# Patient Record
Sex: Female | Born: 1955 | Race: White | Hispanic: No | Smoking: Never smoker
Health system: Southern US, Community
[De-identification: ages and names within clinical notes are randomized; demographics above are authoritative.]

## PROBLEM LIST (undated history)

## (undated) DIAGNOSIS — E785 Hyperlipidemia, unspecified: Secondary | ICD-10-CM

## (undated) DIAGNOSIS — E079 Disorder of thyroid, unspecified: Secondary | ICD-10-CM

---

## 1999-04-19 ENCOUNTER — Other Ambulatory Visit: Admission: RE | Admit: 1999-04-19 | Discharge: 1999-04-19 | Payer: Self-pay | Admitting: Gynecology

## 2000-07-26 ENCOUNTER — Other Ambulatory Visit: Admission: RE | Admit: 2000-07-26 | Discharge: 2000-07-26 | Payer: Self-pay | Admitting: Gynecology

## 2001-08-21 ENCOUNTER — Other Ambulatory Visit: Admission: RE | Admit: 2001-08-21 | Discharge: 2001-08-21 | Payer: Self-pay | Admitting: Gynecology

## 2002-09-12 ENCOUNTER — Other Ambulatory Visit: Admission: RE | Admit: 2002-09-12 | Discharge: 2002-09-12 | Payer: Self-pay | Admitting: Gynecology

## 2003-09-28 ENCOUNTER — Other Ambulatory Visit: Admission: RE | Admit: 2003-09-28 | Discharge: 2003-09-28 | Payer: Self-pay | Admitting: Gynecology

## 2004-10-03 ENCOUNTER — Other Ambulatory Visit: Admission: RE | Admit: 2004-10-03 | Discharge: 2004-10-03 | Payer: Self-pay | Admitting: Gynecology

## 2005-11-07 ENCOUNTER — Other Ambulatory Visit: Admission: RE | Admit: 2005-11-07 | Discharge: 2005-11-07 | Payer: Self-pay | Admitting: Gynecology

## 2006-10-31 ENCOUNTER — Other Ambulatory Visit: Admission: RE | Admit: 2006-10-31 | Discharge: 2006-10-31 | Payer: Self-pay | Admitting: Gynecology

## 2007-11-04 ENCOUNTER — Other Ambulatory Visit: Admission: RE | Admit: 2007-11-04 | Discharge: 2007-11-04 | Payer: Self-pay | Admitting: Gynecology

## 2009-08-18 ENCOUNTER — Ambulatory Visit: Payer: Self-pay | Admitting: Vascular Surgery

## 2009-09-29 ENCOUNTER — Ambulatory Visit: Payer: Self-pay | Admitting: Vascular Surgery

## 2009-10-06 ENCOUNTER — Ambulatory Visit: Payer: Self-pay | Admitting: Vascular Surgery

## 2009-11-26 ENCOUNTER — Ambulatory Visit: Payer: Self-pay | Admitting: Vascular Surgery

## 2011-04-04 NOTE — Procedures (Signed)
DUPLEX DEEP VENOUS EXAM - LOWER EXTREMITY   INDICATION:  Followup laser ablation.   HISTORY:  Edema:  No  Trauma/Surgery:  Greater saphenous vein laser ablation on September 29, 2009  Pain:  No  PE:  No  Previous DVT:  No  Anticoagulants:  Other:   DUPLEX EXAM:                CFV   SFV   PopV  PTV    GSV                R  L  R  L  R  L  R   L  R  L  Thrombosis    o  o  o     o     o      +  Spontaneous   +  +  +     +     +      o  Phasic        +  +  +     +     +      o  Augmentation  +  +  +     +     +      o  Compressible  +  +  +     +     +      o  Competent     +  +  +     o     +      o   Legend:  + - yes  o - no  p - partial  D - decreased   IMPRESSION:  1. No evidence of right lower extremity deep vein thrombosis.  2. The right greater saphenous vein appears to be totally occluded      from the distal insertion site to the groin level.  3. Mild reflux is noted focally at the right saphenofemoral junction      and popliteal vein levels.          _____________________________  Larina Earthly, M.D.   CH/MEDQ  D:  10/07/2009  T:  10/07/2009  Job:  [id number]

## 2011-04-04 NOTE — Consult Note (Signed)
NEW PATIENT CONSULTATION   [name], [name]  DOB:  04-20-1956                                       08/18/2009  ZOXWR#:[id number]   Patient presents today for evaluation of right leg varicose veins and  venous hypertension.  She is a very pleasant, active, healthy 55-year-  old white female with progressive changes of varicose veins in her  medial right knee and medial right calf.  She reports these have been  progressive over time and reports increasing pain associated with these.  This is most pronounced over the varicosities themselves with prolonged  standing.  She does have some generalized aching in her calf as well.  She does not have any specific pain in her left leg and no varicosities  in her left leg.  She does not have any history of deep venous  thrombosis.   Past medical history is negative for major medical difficulties.  She  does have hypothyroidism.   Her family history is negative for premature atherosclerotic disease.   SOCIAL HISTORY:  She is married with 2 children.  She works as a  Company secretary.  She does not smoke or drink alcohol.   REVIEW OF SYSTEMS:  Weight is reported at 185 pounds.  She is 5 feet 10  inches tall.  She denies cardiac, pulmonary, GI, GU, or neuro  dysfunction.   ALLERGIES:  None.   MEDICATIONS:  Synthroid, estrogen, Prometrium, ibuprofen for pain, and  vitamin D.   PHYSICAL EXAMINATION:  A well-developed and well-nourished white female  appearing her stated of age 43.  Blood pressure is 125/78.  Pulse is 66.  Respirations 18.  Her radial pulses are 2+.  She does have 2+ dorsalis  pedis pulses bilaterally.  Left leg is noted to have a few scattered  telangiectasia but no varicose veins.  On her right leg, she has  prominent varicose veins in her medial knee extending down onto her  right medial calf.   She underwent formal venous duplex in our office, and this reveals gross  reflux throughout her  right great saphenous vein extending into the  varicosities.  Her deep system does not have any evidence of reflux.   I discussed options with patient.  She had seen an outlying vein center  and has been in thigh-high graduated compression stockings for  approximately 6 months.  She reports that despite this, she is having  increased discomfort with prolonged standing, making it difficult for  her to do her work, which requires prolonged standing and also her usual  activities of daily living.  I have recommended right leg great  saphenous vein ablation and stab phlebectomy for relief of her symptoms.  We will proceed with this once we have assured insurance coverage.   Larina Earthly, M.D.  Electronically Signed   TFE/MEDQ  D:  08/18/2009  T:  08/19/2009  Job:  [id number]   cc:   Gretta Cool, M.D.

## 2011-04-04 NOTE — Assessment & Plan Note (Signed)
OFFICE VISIT   Tracey [name], Tracey [name]  DOB:  04-Dec-1955                                       10/06/2009  ZOXWR#:[id number]   The patient presents today for a 1 week followup of her laser ablation  of her left great saphenous vein and stab phlebectomy of multiple  tributary varicosities over her thigh and calf.  She has mild to  moderate bruising from the procedure.  She does have some discomfort  over the medial knee area over the ablation and also her thigh.  The  incisions are healing quite nicely.  She underwent repeat venous duplex  today in our office and this reveals closure of her saphenous vein of  her great saphenous vein and also reveals no evidence of DVT.  I am  quite pleased with her initial result and plan to see her again in 6  weeks for final followup.   Larina Earthly, M.D.  Electronically Signed   TFE/MEDQ  D:  10/06/2009  T:  10/07/2009  Job:  [id number]

## 2011-04-04 NOTE — Assessment & Plan Note (Signed)
OFFICE VISIT   Tracey [name], Tracey [name]  DOB:  01/13/1956                                       11/26/2009  EAVWU#:[id number]   Patient presents today for final follow-up of her laser ablation of her  right great saphenous vein and stab phlebectomy on 09/29/09.  She is  quite pleased with the results.   She has complete resolution of any bruising that she had and no erythema  at the level of her ablation site.  Stab phlebectomy sites were all  healing quite nicely.   I am quite pleased with her initial result, as is the patient.  We plan  to see her again on an as-needed basis.     Larina Earthly, M.D.  Electronically Signed   TFE/MEDQ  D:  11/26/2009  T:  11/29/2009  Job:  [id number]

## 2011-04-04 NOTE — Procedures (Signed)
LOWER EXTREMITY VENOUS REFLUX EXAM   INDICATION:  Right lower extremity swelling and pain.   EXAM:  Using color-flow imaging and pulse Doppler spectral analysis, the  right common femoral, superficial femoral, popliteal, posterior tibial,  greater and lesser saphenous veins are evaluated.  There is no evidence  suggesting deep venous insufficiency in the right lower extremity.   The right saphenofemoral junction is not competent.  The right GSV is  not competent with the caliber as described below.   The right proximal short saphenous vein demonstrates competency.   GSV Diameter (used if found to be incompetent only)                                            Right    Left  Proximal Greater Saphenous Vein           0.85 cm  cm  Proximal-to-mid-thigh                     0.54 cm  cm  Mid thigh                                 0.49 cm  cm  Mid-distal thigh                          cm       cm  Distal thigh                              0.53 cm  cm  Knee                                      0.35 cm  cm   IMPRESSION:  1. Right greater saphenous vein reflux is identified with the caliber      ranging from 0.85 cm to 0.35 cm knee to groin.  2. The right greater saphenous vein is not aneurysmal.  3. The right greater saphenous vein is not tortuous.  4. The deep venous system is competent.  5. The right lesser saphenous vein is competent.  6. The greater saphenous vein demonstrates 2035 meters per second      reflux in the proximal thigh.         ___________________________________________  Larina Earthly, M.D.   CJ/MEDQ  D:  08/18/2009  T:  08/18/2009  Job:  [id number]

## 2011-04-04 NOTE — Assessment & Plan Note (Signed)
OFFICE VISIT   Tracey [name], Tracey [name]  DOB:  Sep 21, 1956                                       09/29/2009  ZOXWR#:[id number]   This patient presents today for right great saphenous vein laser  ablation and stab phlebectomy.  She tolerated the procedure without  immediate complication and will be seen again in 1 week for follow-up.   Larina Earthly, M.D.  Electronically Signed   TFE/MEDQ  D:  09/29/2009  T:  09/30/2009  Job:  [id number]

## 2015-07-31 ENCOUNTER — Encounter (HOSPITAL_BASED_OUTPATIENT_CLINIC_OR_DEPARTMENT_OTHER): Payer: Self-pay | Admitting: Emergency Medicine

## 2015-07-31 ENCOUNTER — Emergency Department (HOSPITAL_BASED_OUTPATIENT_CLINIC_OR_DEPARTMENT_OTHER)
Admission: EM | Admit: 2015-07-31 | Discharge: 2015-07-31 | Disposition: A | Discharge status: Home / Self Care | Payer: No Typology Code available for payment source | Attending: Emergency Medicine | Admitting: Emergency Medicine

## 2015-07-31 ENCOUNTER — Emergency Department (HOSPITAL_BASED_OUTPATIENT_CLINIC_OR_DEPARTMENT_OTHER): Payer: No Typology Code available for payment source

## 2015-07-31 DIAGNOSIS — Y92410 Unspecified street and highway as the place of occurrence of the external cause: Secondary | ICD-10-CM | POA: Diagnosis not present

## 2015-07-31 DIAGNOSIS — Y9389 Activity, other specified: Secondary | ICD-10-CM | POA: Insufficient documentation

## 2015-07-31 DIAGNOSIS — Z79899 Other long term (current) drug therapy: Secondary | ICD-10-CM | POA: Diagnosis not present

## 2015-07-31 DIAGNOSIS — S39012A Strain of muscle, fascia and tendon of lower back, initial encounter: Secondary | ICD-10-CM

## 2015-07-31 DIAGNOSIS — Y998 Other external cause status: Secondary | ICD-10-CM | POA: Insufficient documentation

## 2015-07-31 DIAGNOSIS — E079 Disorder of thyroid, unspecified: Secondary | ICD-10-CM | POA: Insufficient documentation

## 2015-07-31 DIAGNOSIS — S29012A Strain of muscle and tendon of back wall of thorax, initial encounter: Secondary | ICD-10-CM | POA: Insufficient documentation

## 2015-07-31 DIAGNOSIS — S299XXA Unspecified injury of thorax, initial encounter: Secondary | ICD-10-CM | POA: Diagnosis present

## 2015-07-31 HISTORY — DX: Hyperlipidemia, unspecified: E78.5

## 2015-07-31 HISTORY — DX: Disorder of thyroid, unspecified: E07.9

## 2015-07-31 MED ORDER — IBUPROFEN 800 MG PO TABS: 800.0000 mg | ORAL_TABLET | Freq: Three times a day (TID) | ORAL | Status: AC

## 2015-07-31 MED ORDER — CYCLOBENZAPRINE HCL 10 MG PO TABS: 10.0000 mg | ORAL_TABLET | Freq: Two times a day (BID) | ORAL | Status: AC | PRN

## 2015-07-31 MED ORDER — CYCLOBENZAPRINE HCL 10 MG PO TABS
10.0000 mg | ORAL_TABLET | Freq: Once | ORAL | Status: AC
  Administered 2015-07-31: 10 mg via ORAL
  Filled 2015-07-31: qty 1

## 2015-07-31 MED ORDER — IBUPROFEN 800 MG PO TABS
800.0000 mg | ORAL_TABLET | Freq: Once | ORAL | Status: AC
Start: 2015-07-31 — End: 2015-07-31
  Administered 2015-07-31: 800 mg via ORAL
  Filled 2015-07-31: qty 1

## 2015-07-31 NOTE — Discharge Instructions (Signed)
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
## 2018-10-23 ENCOUNTER — Other Ambulatory Visit: Payer: Self-pay | Admitting: Obstetrics & Gynecology

## 2018-10-23 DIAGNOSIS — Z1231 Encounter for screening mammogram for malignant neoplasm of breast: Secondary | ICD-10-CM

## 2018-11-06 ENCOUNTER — Ambulatory Visit
Admission: RE | Admit: 2018-11-06 | Discharge: 2018-11-06 | Disposition: A | Discharge status: Home / Self Care | Payer: BLUE CROSS/BLUE SHIELD | Source: Ambulatory Visit | Attending: Obstetrics & Gynecology | Admitting: Obstetrics & Gynecology

## 2018-11-06 DIAGNOSIS — Z1231 Encounter for screening mammogram for malignant neoplasm of breast: Secondary | ICD-10-CM

## 2019-09-25 ENCOUNTER — Other Ambulatory Visit: Payer: Self-pay | Admitting: Family Medicine

## 2019-09-25 DIAGNOSIS — Z1231 Encounter for screening mammogram for malignant neoplasm of breast: Secondary | ICD-10-CM

## 2019-11-19 ENCOUNTER — Other Ambulatory Visit: Payer: Self-pay

## 2019-11-19 DIAGNOSIS — Z20822 Contact with and (suspected) exposure to covid-19: Secondary | ICD-10-CM

## 2019-11-20 ENCOUNTER — Ambulatory Visit: Payer: BLUE CROSS/BLUE SHIELD

## 2019-11-20 LAB — NOVEL CORONAVIRUS, NAA: SARS-CoV-2, NAA: DETECTED — AB

## 2019-11-21 ENCOUNTER — Encounter (HOSPITAL_COMMUNITY): Payer: Self-pay | Admitting: Critical Care Medicine

## 2019-11-21 ENCOUNTER — Telehealth (HOSPITAL_COMMUNITY): Payer: Self-pay | Admitting: Critical Care Medicine

## 2019-11-21 DIAGNOSIS — E039 Hypothyroidism, unspecified: Secondary | ICD-10-CM | POA: Insufficient documentation

## 2019-11-21 NOTE — Telephone Encounter (Signed)
I connected with this patient who is Covid positive from December 30.  She became ill on 28 December.  She knows she needs to stay in isolation until December 8.  I gave her recommendations on repeat not retesting for 3 months.  I recommended she connect with her primary care provider next week.  She is not a monoclonal antibody candidate.  She has very mild symptoms

## 2019-12-30 ENCOUNTER — Ambulatory Visit
Admission: RE | Admit: 2019-12-30 | Discharge: 2019-12-30 | Disposition: A | Discharge status: Home / Self Care | Payer: BC Managed Care – PPO | Source: Ambulatory Visit | Attending: Family Medicine | Admitting: Family Medicine

## 2019-12-30 ENCOUNTER — Other Ambulatory Visit: Payer: Self-pay

## 2019-12-30 DIAGNOSIS — Z1231 Encounter for screening mammogram for malignant neoplasm of breast: Secondary | ICD-10-CM

## 2020-02-12 ENCOUNTER — Ambulatory Visit: Payer: BC Managed Care – PPO

## 2020-11-02 ENCOUNTER — Other Ambulatory Visit: Payer: Self-pay | Admitting: Family Medicine

## 2020-11-02 DIAGNOSIS — Z1231 Encounter for screening mammogram for malignant neoplasm of breast: Secondary | ICD-10-CM

## 2021-01-04 ENCOUNTER — Ambulatory Visit
Admission: RE | Admit: 2021-01-04 | Discharge: 2021-01-04 | Disposition: A | Discharge status: Home / Self Care | Payer: BC Managed Care – PPO | Source: Ambulatory Visit | Attending: Family Medicine | Admitting: Family Medicine

## 2021-01-04 ENCOUNTER — Other Ambulatory Visit: Payer: Self-pay

## 2021-01-04 DIAGNOSIS — Z1231 Encounter for screening mammogram for malignant neoplasm of breast: Secondary | ICD-10-CM

## 2021-10-25 LAB — EXTERNAL GENERIC LAB PROCEDURE: COLOGUARD: NEGATIVE

## 2021-10-25 LAB — COLOGUARD: COLOGUARD: NEGATIVE

## 2021-11-28 ENCOUNTER — Other Ambulatory Visit: Payer: Self-pay | Admitting: Family Medicine

## 2021-11-28 DIAGNOSIS — Z1231 Encounter for screening mammogram for malignant neoplasm of breast: Secondary | ICD-10-CM

## 2022-01-09 ENCOUNTER — Ambulatory Visit
Admission: RE | Admit: 2022-01-09 | Discharge: 2022-01-09 | Disposition: A | Discharge status: Home / Self Care | Payer: BC Managed Care – PPO | Source: Ambulatory Visit | Attending: Family Medicine | Admitting: Family Medicine

## 2022-01-09 ENCOUNTER — Other Ambulatory Visit: Payer: Self-pay

## 2022-01-09 DIAGNOSIS — Z1231 Encounter for screening mammogram for malignant neoplasm of breast: Secondary | ICD-10-CM

## 2022-11-28 ENCOUNTER — Other Ambulatory Visit: Payer: Self-pay | Admitting: Family Medicine

## 2022-11-28 DIAGNOSIS — Z1231 Encounter for screening mammogram for malignant neoplasm of breast: Secondary | ICD-10-CM

## 2023-01-19 ENCOUNTER — Ambulatory Visit
Admission: RE | Admit: 2023-01-19 | Discharge: 2023-01-19 | Disposition: A | Discharge status: Home / Self Care | Payer: BC Managed Care – PPO | Source: Ambulatory Visit | Attending: Family Medicine | Admitting: Family Medicine

## 2023-01-19 DIAGNOSIS — Z1231 Encounter for screening mammogram for malignant neoplasm of breast: Secondary | ICD-10-CM

## 2023-01-24 ENCOUNTER — Other Ambulatory Visit: Payer: Self-pay | Admitting: Family Medicine

## 2023-01-24 DIAGNOSIS — R928 Other abnormal and inconclusive findings on diagnostic imaging of breast: Secondary | ICD-10-CM

## 2023-02-05 ENCOUNTER — Encounter: Payer: Self-pay | Admitting: Family Medicine

## 2023-02-09 ENCOUNTER — Other Ambulatory Visit: Payer: Self-pay | Admitting: Family Medicine

## 2023-02-09 ENCOUNTER — Ambulatory Visit
Admission: RE | Admit: 2023-02-09 | Discharge: 2023-02-09 | Disposition: A | Discharge status: Home / Self Care | Payer: Medicaid Other | Source: Ambulatory Visit | Attending: Family Medicine | Admitting: Family Medicine

## 2023-02-09 DIAGNOSIS — R921 Mammographic calcification found on diagnostic imaging of breast: Secondary | ICD-10-CM

## 2023-02-09 DIAGNOSIS — R928 Other abnormal and inconclusive findings on diagnostic imaging of breast: Secondary | ICD-10-CM

## 2023-02-23 ENCOUNTER — Ambulatory Visit
Admission: RE | Admit: 2023-02-23 | Discharge: 2023-02-23 | Disposition: A | Discharge status: Home / Self Care | Payer: Medicaid Other | Source: Ambulatory Visit | Attending: Family Medicine | Admitting: Family Medicine

## 2023-02-23 DIAGNOSIS — R928 Other abnormal and inconclusive findings on diagnostic imaging of breast: Secondary | ICD-10-CM

## 2023-02-23 DIAGNOSIS — R921 Mammographic calcification found on diagnostic imaging of breast: Secondary | ICD-10-CM

## 2023-02-23 HISTORY — PX: BREAST BIOPSY: SHX20

## 2024-01-01 ENCOUNTER — Other Ambulatory Visit: Payer: Self-pay | Admitting: Family Medicine

## 2024-01-01 DIAGNOSIS — Z1231 Encounter for screening mammogram for malignant neoplasm of breast: Secondary | ICD-10-CM

## 2024-01-25 ENCOUNTER — Ambulatory Visit
Admission: RE | Admit: 2024-01-25 | Discharge: 2024-01-25 | Disposition: A | Discharge status: Home / Self Care | Payer: Medicare HMO | Source: Ambulatory Visit | Attending: Family Medicine | Admitting: Family Medicine

## 2024-01-25 DIAGNOSIS — Z1231 Encounter for screening mammogram for malignant neoplasm of breast: Secondary | ICD-10-CM

## 2024-01-31 ENCOUNTER — Other Ambulatory Visit: Payer: Self-pay | Admitting: Family Medicine

## 2024-01-31 DIAGNOSIS — R928 Other abnormal and inconclusive findings on diagnostic imaging of breast: Secondary | ICD-10-CM

## 2024-02-03 IMAGING — MG MM DIGITAL SCREENING BILAT W/ TOMO AND CAD
6 of 10 series · 6 of 30 positions shown · non-contrast
Comparison: Previous exam(s).

CLINICAL DATA: Screening.

EXAM:
DIGITAL SCREENING BILATERAL MAMMOGRAM WITH TOMOSYNTHESIS AND CAD
TECHNIQUE: Bilateral screening digital craniocaudal and mediolateral oblique
mammograms were obtained. Bilateral screening digital breast
tomosynthesis was performed. The images were evaluated with
computer-aided detection.

[R CC synth-2D]
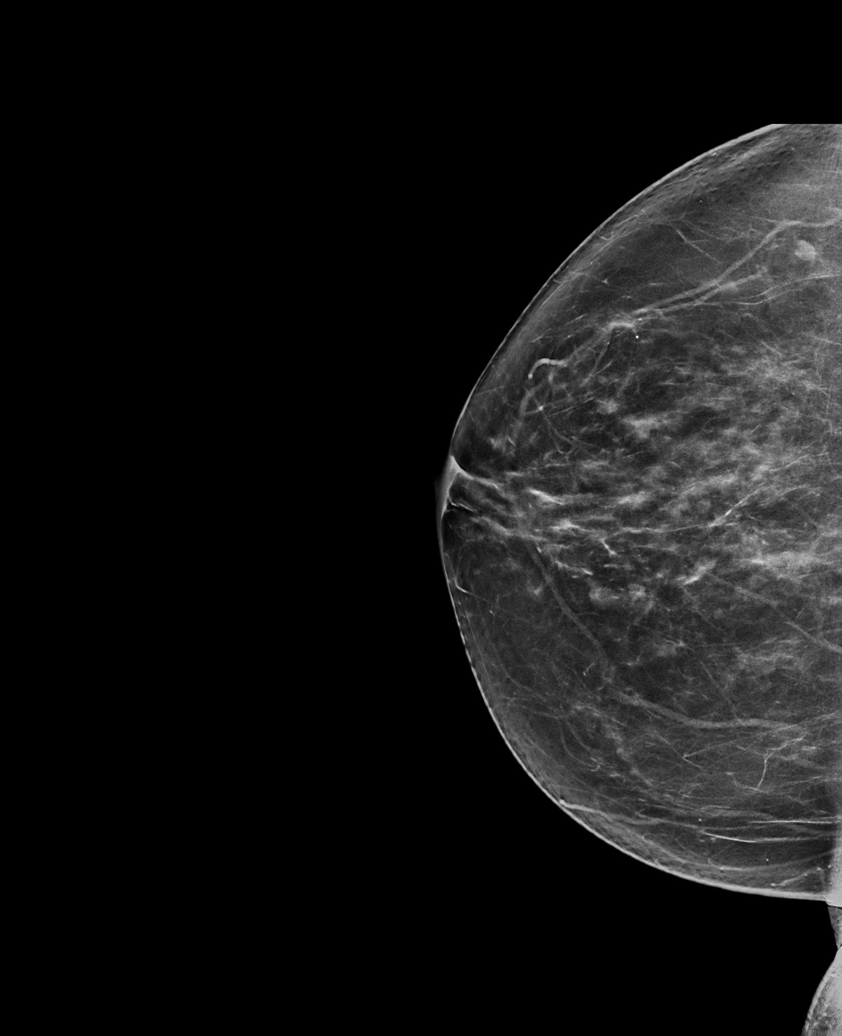

[R MLO synth-2D (1 of 2)]
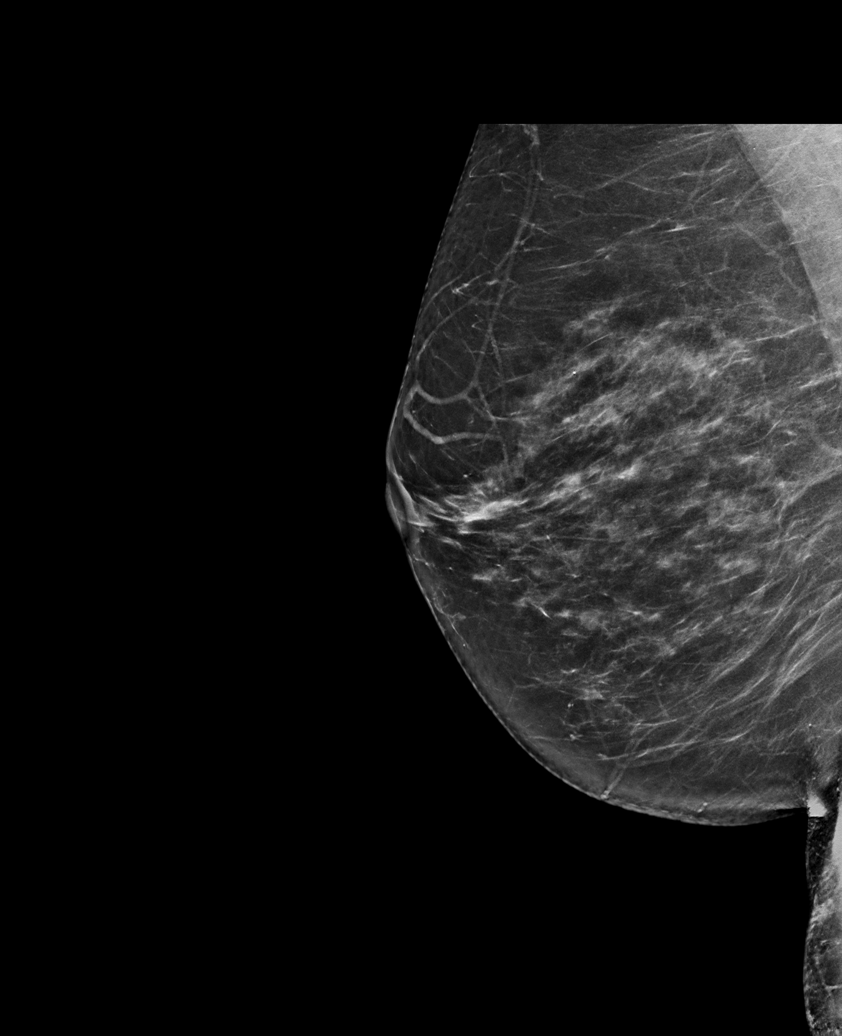

[L MLO synth-2D]
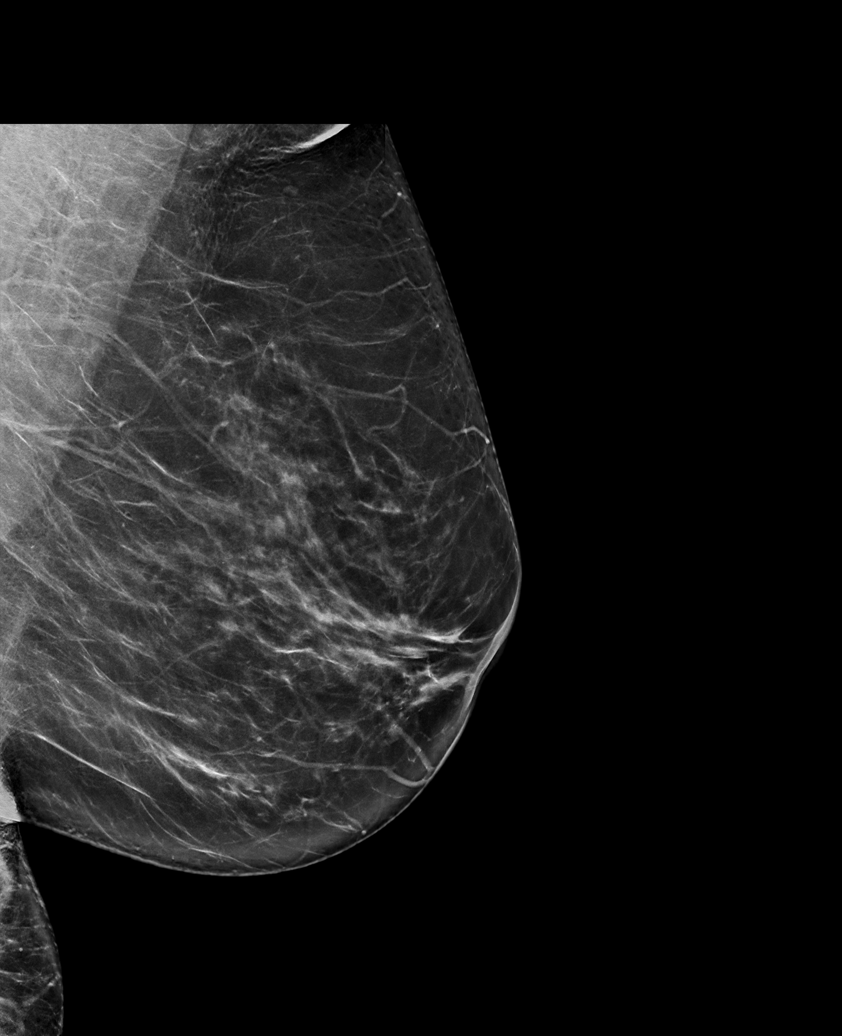

[R MLO synth-2D (2 of 2)]
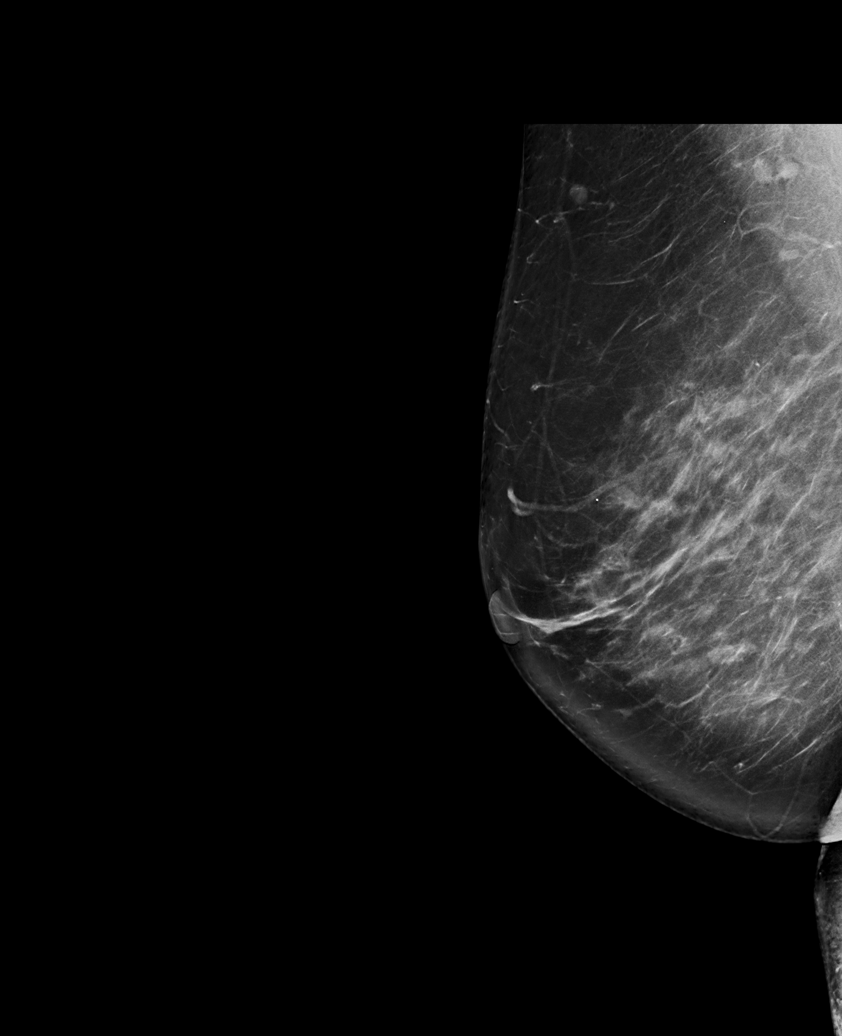

[L CC synth-2D]
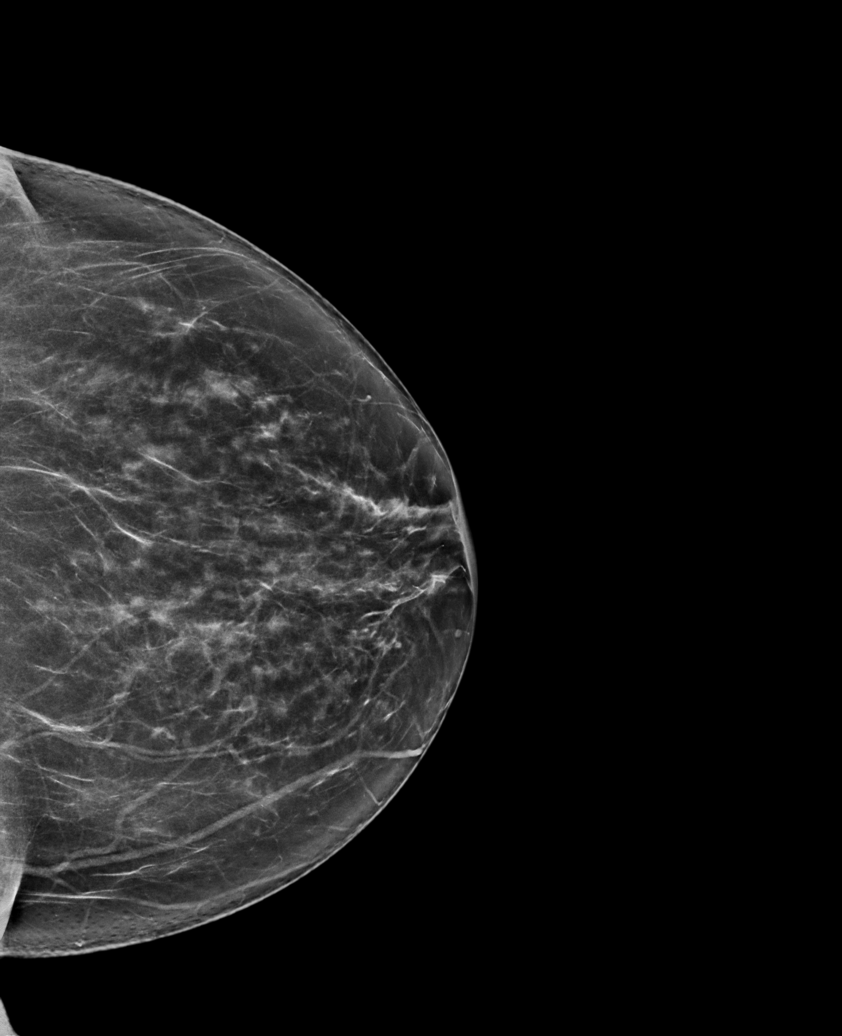

[R CC tomo · tomo slice 41/80.0]
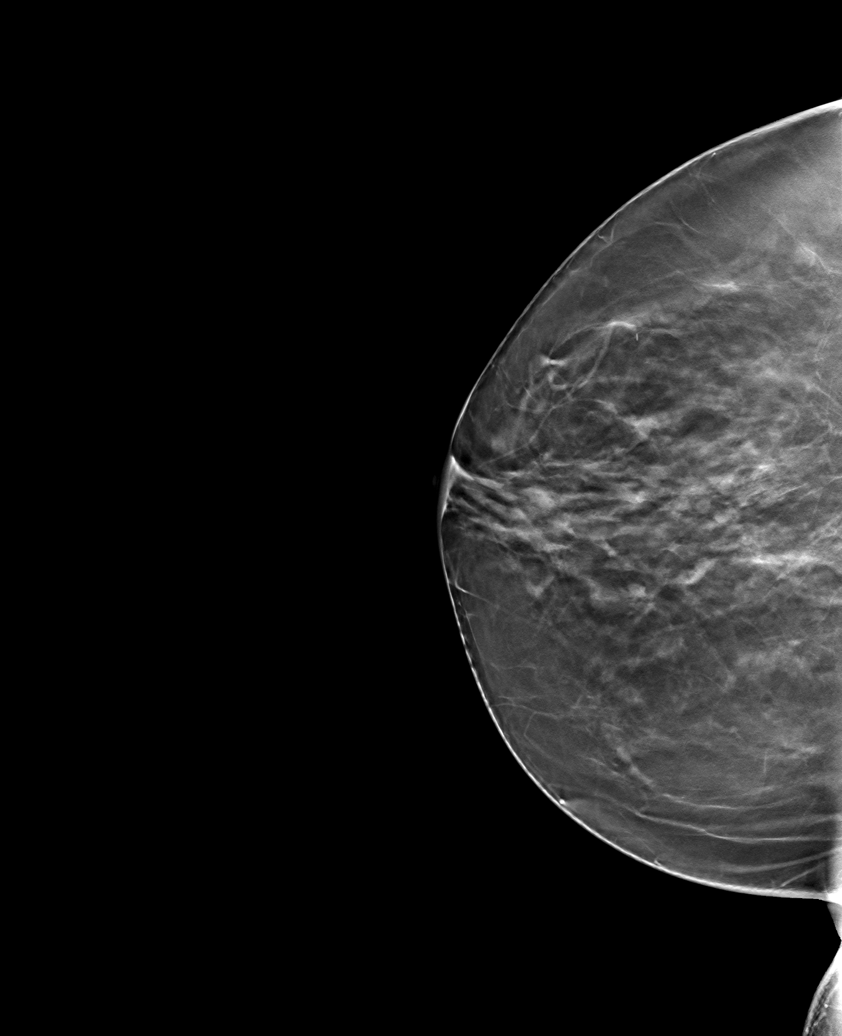

[6 of 30 positions shown; findings below may reference images not displayed]

ACR Breast Density Category b: There are scattered areas of
fibroglandular density.
FINDINGS: There are no findings suspicious for malignancy.
IMPRESSION: No mammographic evidence of malignancy. A result letter of this
screening mammogram will be mailed directly to the patient.

RECOMMENDATION:
Screening mammogram in one year. (Code:51-O-LD2)

BI-RADS CATEGORY  1: Negative.

## 2024-02-06 ENCOUNTER — Other Ambulatory Visit: Payer: Self-pay | Admitting: Obstetrics and Gynecology

## 2024-02-06 DIAGNOSIS — R928 Other abnormal and inconclusive findings on diagnostic imaging of breast: Secondary | ICD-10-CM

## 2024-02-07 ENCOUNTER — Ambulatory Visit
Admission: RE | Admit: 2024-02-07 | Discharge: 2024-02-07 | Disposition: A | Discharge status: Home / Self Care | Source: Ambulatory Visit | Attending: Family Medicine | Admitting: Family Medicine

## 2024-02-07 DIAGNOSIS — R928 Other abnormal and inconclusive findings on diagnostic imaging of breast: Secondary | ICD-10-CM

## 2024-12-19 ENCOUNTER — Other Ambulatory Visit: Payer: Self-pay | Admitting: Obstetrics and Gynecology

## 2024-12-19 DIAGNOSIS — Z1231 Encounter for screening mammogram for malignant neoplasm of breast: Secondary | ICD-10-CM

## 2025-02-12 ENCOUNTER — Ambulatory Visit
